# Patient Record
Sex: Female | Born: 1950 | Race: White | Hispanic: No | Smoking: Never smoker
Health system: Southern US, Community
[De-identification: ages and names within clinical notes are randomized; demographics above are authoritative.]

## PROBLEM LIST (undated history)

## (undated) DIAGNOSIS — I10 Essential (primary) hypertension: Secondary | ICD-10-CM

## (undated) DIAGNOSIS — N189 Chronic kidney disease, unspecified: Secondary | ICD-10-CM

## (undated) DIAGNOSIS — C801 Malignant (primary) neoplasm, unspecified: Secondary | ICD-10-CM

## (undated) HISTORY — PX: SKIN CANCER EXCISION: SHX779

## (undated) HISTORY — PX: BUNIONECTOMY: SHX129

## (undated) HISTORY — PX: BACK SURGERY: SHX140

---

## 2004-11-19 ENCOUNTER — Ambulatory Visit: Payer: Self-pay | Admitting: Internal Medicine

## 2005-11-06 ENCOUNTER — Ambulatory Visit: Payer: Self-pay | Admitting: Internal Medicine

## 2005-12-11 ENCOUNTER — Ambulatory Visit: Payer: Self-pay | Admitting: Internal Medicine

## 2006-12-14 ENCOUNTER — Ambulatory Visit: Payer: Self-pay | Admitting: Internal Medicine

## 2007-02-24 ENCOUNTER — Ambulatory Visit: Payer: Self-pay | Admitting: Unknown Physician Specialty

## 2007-05-20 ENCOUNTER — Ambulatory Visit: Payer: Self-pay | Admitting: Internal Medicine

## 2007-06-18 ENCOUNTER — Inpatient Hospital Stay (HOSPITAL_COMMUNITY): Admission: RE | Admit: 2007-06-18 | Discharge: 2007-06-21 | Payer: Self-pay | Admitting: Neurosurgery

## 2007-12-15 ENCOUNTER — Ambulatory Visit: Payer: Self-pay | Admitting: Internal Medicine

## 2008-12-25 ENCOUNTER — Ambulatory Visit: Payer: Self-pay | Admitting: Internal Medicine

## 2010-01-01 ENCOUNTER — Ambulatory Visit: Payer: Self-pay | Admitting: Internal Medicine

## 2010-09-10 NOTE — Op Note (Signed)
NAMESHIMA, COMPERE          ACCOUNT NO.:  [id number]   MEDICAL RECORD NO.:  [id number]          PATIENT TYPE:  INP   LOCATION:  2899                         FACILITY:  [facility]   PHYSICIAN:  Danae Orleans. Venetia Maxon, M.D.  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   PROCEDURE:  Resection of synovial cyst L5-S1 with L4-5 bilateral  decompressive laminectomy, L5-S1 transforaminal lumbar interbody fusion  with a 9 mm PEEK interbody cage and morselized bone autograft and bone  morphogenic protein with FortrOss and post pedicle screw fixation L4  through S1 bilaterally with posterolateral subsegmental instrumentation  L4 through S1 bilaterally with posterolateral arthrodesis of FortrOss  and BMP on the right posterolateral region and morselized bone autograft  on the left posterolateral region.   SURGEON:  Danae Orleans. Venetia Maxon, M.D.   ASSISTANT:  Georgiann Cocker, RN and Clydene Fake, M.D.   ANESTHESIA:  General endotracheal anesthesia.   ESTIMATED BLOOD LOSS:  Six hundred fifty mL with 300 mL Cell Saver blood  returned to the patient.   COMPLICATIONS:  None.   DISPOSITION:  Recovery.   INDICATIONS:  Molly Robbins is a 60 year old woman with spinal  stenosis, spondylolisthesis, synovial cyst who elected to be taken to  surgery for decompression and fusion at these affected levels.   PROCEDURE IN DETAIL:  Molly Robbins was brought to the operating room.  Following a satisfactory and uncomplicated induction of general  endotracheal anesthesia plus intravenous lines and Foley catheter, EMG  electrodes were placed in the L2, L3, L4, L5 and in S1 dermatomes.  The  patient was turned to prone position on the Gabbs table.  Her soft  tissue and bony prominences were padded appropriately.  Her low back was  then prepped and draped in the usual sterile fashion.  The area of  proposed incision was infiltrated with quarter percent Marcaine and  lidocaine with 1:200,000 epinephrine.  The incision was made in the  midline and carried through copious adipose tissue.  The lumbodorsal  fascia was incised which was incised bilaterally.  Subperiosteal  dissection was performed exposing the L4-L5 transverse processes and  sacral ala and self-retaining retractors were placed to facilitate  exposure.  Intraoperative x-rays confirmed correct orientation and  correct levels.  Subsequently a laminotomy of L4 and L5 was performed on  the left and Baxano neural localization probes were introduced and  passed out the neural foramina without difficulty.  Because of  inconsistent electrocautery recordings we elected not to perform  foraminal decompression with the [facility] cutting instrument.  Subsequently total laminectomy of L4 and L5 were performed and the  central spinal canal dura was decompressed both the L4, L5 and sacral  nerve roots were decompressed bilaterally widely.  There was significant  synovial cyst on the left side of the midline which extended out the  neural foramen  which was causing compression and deflection of the L5  nerve root on the left.  Under careful loupe dissection the synovial  cyst was resected and the nerve root appeared to be well decompressed.  Subsequently a diskectomy was performed at the left side of midline at  the L5-S1 level and the endplates were prepared with a variety of  curettes and ring curettes.  After trial sizing a 9 mm large PEEK banana  shaped implant was packed with portion of small BMP kit and also  FortrOss which had been reconstituted and 10 mL of FortrOss which had  been reconstituted with 6 mL of autogenous blood.   The cage was tamped  into position.  Additional BMP had been placed deep to the cage along  with more FortrOss and after the cage was positioned appropriately  additional bone graft and FortrOss material were placed in the  interspace.   Attention was then turned to the L4-5 level where thorough decompression  was performed.  It was felt that a diskectomy would be difficult because  of the relatively small interval between the thecal sac and nerve roots  and it was consequently elected to do a thorough decompression but not  to do an interbody cage at this level.  Subsequently pedicle screws were  placed 6.5 x 40 mm sacral screws, 5.5 x 40 left L5, 5.5 x 45 right L5,  5.5 x 50 L4 screws.  All screws had excellent purchase.  Their position  was confirmed on AP and lateral fluoroscopy.  The posterolateral region  had been decorticated and on the right side of the midline remaining BMP  from the small kit was placed along with FortrOss in the decorticated  posterolateral region and on the left side of midline the remaining bone  autograft was placed.  Sixty mm rods, preloaded rods were locked down in  situ, neural elements were inspected and felt to be well decompressed.  There was excellent hemostasis.  No evidence of any CSF leak.  The self-  retaining retractor was then removed and the lumbodorsal fascia was  closed with 1 Vicryl suture.  Subcutaneous tissues were reapproximated  with 2-0 Vicryl interrupted inverted sutures.  Skin edges were  reapproximated with interrupted 3-0 Vicryl subcuticular stitch.  The  wound was dressed with Benzoin, Steri-Strips, Telfa tape.  The patient  was extubated in the operating room and taken to recovery having  tolerated the surgery well.      Danae Orleans. Venetia Maxon, M.D.  Electronically Signed     JDS/MEDQ  D:  06/18/2007  T:  06/19/2007  Job:  [id number]

## 2010-09-13 NOTE — Discharge Summary (Signed)
[name], [name]          ACCOUNT NO.:  [id number]   MEDICAL RECORD NO.:  [id number]          PATIENT TYPE:  INP   LOCATION:  3011                         FACILITY:  [facility]   PHYSICIAN:  Danae Orleans. Venetia Maxon, M.D.  DATE OF BIRTH:  1951-02-17   DATE OF ADMISSION:  06/18/2007  DATE OF DISCHARGE:  06/21/2007                               DISCHARGE SUMMARY   REASONS FOR ADMISSION:  1. Lumbar spondylolisthesis.  2. Lumbar spondylosis.  3. Lumbar synovial cysts.  4. Lumbar arthropathy.  5. Esophageal reflux.   FINAL DIAGNOSES:  1. Lumbar spondylolisthesis.  2. Lumbar spondylosis  3. Lumbar synovial cyst.  4. Lumbar arthropathy.  5. Esophageal reflux.   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  She does have significant weakness.  She is  admitted to the hospital and underwent lumbar decompression and  resection of synovial cyst, L4 through sacral levels.  Postoperatively,  she was gradually mobilized and was discharged home in stable and  satisfactory condition having tolerated the operation well, with  discharge medications of Percocet and Valium.   DISCHARGE INSTRUCTIONS:  To follow up the office in 3 weeks,  postoperatively, with improved discharged status, and to wear back brace  when up and walking.      Danae Orleans. Venetia Maxon, M.D.  Electronically Signed     JDS/MEDQ  D:  07/22/2007  T:  07/23/2007  Job:  [id number]

## 2010-12-02 ENCOUNTER — Ambulatory Visit: Payer: Self-pay | Admitting: Internal Medicine

## 2011-01-17 LAB — POCT I-STAT 7, (LYTES, BLD GAS, ICA,H+H)
Acid-Base Excess: 3 — ABNORMAL HIGH
Acid-Base Excess: 5 — ABNORMAL HIGH
Bicarbonate: 27.4 — ABNORMAL HIGH
Calcium, Ion: 1.21
HCT: 28 — ABNORMAL LOW
O2 Saturation: 100
Operator id: 151301
Sodium: 140
pO2, Arterial: 330 — ABNORMAL HIGH
pO2, Arterial: 392 — ABNORMAL HIGH

## 2011-01-17 LAB — CBC
Hemoglobin: 13.2
MCV: 93.1
RBC: 4.25
WBC: 6.4

## 2011-01-17 LAB — BASIC METABOLIC PANEL
Chloride: 101
Creatinine, Ser: 1.2
GFR calc Af Amer: 56 — ABNORMAL LOW
Potassium: 3.8
Sodium: 139

## 2011-01-17 LAB — TYPE AND SCREEN: ABO/RH(D): O POS

## 2011-01-17 LAB — ABO/RH: ABO/RH(D): O POS

## 2011-01-17 LAB — POCT I-STAT GLUCOSE: Operator id: 151301

## 2011-01-24 ENCOUNTER — Ambulatory Visit: Payer: Self-pay | Admitting: Internal Medicine

## 2011-04-04 ENCOUNTER — Ambulatory Visit: Payer: Self-pay | Admitting: Unknown Physician Specialty

## 2011-04-07 LAB — PATHOLOGY REPORT

## 2012-01-26 ENCOUNTER — Ambulatory Visit: Payer: Self-pay | Admitting: Internal Medicine

## 2012-01-28 ENCOUNTER — Ambulatory Visit: Payer: Self-pay | Admitting: Internal Medicine

## 2012-07-29 ENCOUNTER — Ambulatory Visit: Payer: Self-pay | Admitting: Internal Medicine

## 2012-12-17 ENCOUNTER — Ambulatory Visit: Payer: Self-pay | Admitting: Internal Medicine

## 2013-02-01 ENCOUNTER — Ambulatory Visit: Payer: Self-pay | Admitting: Internal Medicine

## 2013-08-25 ENCOUNTER — Ambulatory Visit: Payer: Self-pay | Admitting: Internal Medicine

## 2014-02-10 ENCOUNTER — Ambulatory Visit: Payer: Self-pay | Admitting: Internal Medicine

## 2015-01-25 ENCOUNTER — Other Ambulatory Visit: Payer: Self-pay | Admitting: Internal Medicine

## 2015-01-25 DIAGNOSIS — Z1231 Encounter for screening mammogram for malignant neoplasm of breast: Secondary | ICD-10-CM

## 2015-02-23 ENCOUNTER — Ambulatory Visit
Admission: RE | Admit: 2015-02-23 | Discharge: 2015-02-23 | Disposition: A | Discharge status: Home / Self Care | Payer: BLUE CROSS/BLUE SHIELD | Source: Ambulatory Visit | Attending: Internal Medicine | Admitting: Internal Medicine

## 2015-02-23 DIAGNOSIS — Z1231 Encounter for screening mammogram for malignant neoplasm of breast: Secondary | ICD-10-CM | POA: Diagnosis not present

## 2016-01-17 ENCOUNTER — Other Ambulatory Visit: Payer: Self-pay | Admitting: Internal Medicine

## 2016-01-17 DIAGNOSIS — Z1231 Encounter for screening mammogram for malignant neoplasm of breast: Secondary | ICD-10-CM

## 2016-02-28 ENCOUNTER — Ambulatory Visit
Admission: RE | Admit: 2016-02-28 | Discharge: 2016-02-28 | Disposition: A | Discharge status: Home / Self Care | Payer: Medicare Other | Source: Ambulatory Visit | Attending: Internal Medicine | Admitting: Internal Medicine

## 2016-02-28 DIAGNOSIS — Z1231 Encounter for screening mammogram for malignant neoplasm of breast: Secondary | ICD-10-CM | POA: Insufficient documentation

## 2016-10-17 ENCOUNTER — Other Ambulatory Visit: Payer: Self-pay | Admitting: Internal Medicine

## 2016-10-17 DIAGNOSIS — M79604 Pain in right leg: Secondary | ICD-10-CM

## 2016-11-06 ENCOUNTER — Ambulatory Visit
Admission: RE | Admit: 2016-11-06 | Discharge: 2016-11-06 | Disposition: A | Discharge status: Home / Self Care | Payer: Medicare Other | Source: Ambulatory Visit | Attending: Internal Medicine | Admitting: Internal Medicine

## 2016-11-06 DIAGNOSIS — M7121 Synovial cyst of popliteal space [Baker], right knee: Secondary | ICD-10-CM | POA: Diagnosis not present

## 2016-11-06 DIAGNOSIS — M79604 Pain in right leg: Secondary | ICD-10-CM | POA: Insufficient documentation

## 2016-12-23 ENCOUNTER — Other Ambulatory Visit: Payer: Self-pay | Admitting: Internal Medicine

## 2016-12-23 DIAGNOSIS — Z1231 Encounter for screening mammogram for malignant neoplasm of breast: Secondary | ICD-10-CM

## 2017-03-12 ENCOUNTER — Ambulatory Visit
Admission: RE | Admit: 2017-03-12 | Discharge: 2017-03-12 | Disposition: A | Discharge status: Home / Self Care | Payer: Medicare Other | Source: Ambulatory Visit | Attending: Internal Medicine | Admitting: Internal Medicine

## 2017-03-12 DIAGNOSIS — Z1231 Encounter for screening mammogram for malignant neoplasm of breast: Secondary | ICD-10-CM | POA: Insufficient documentation

## 2018-02-19 ENCOUNTER — Other Ambulatory Visit: Payer: Self-pay | Admitting: Internal Medicine

## 2018-02-19 DIAGNOSIS — Z1231 Encounter for screening mammogram for malignant neoplasm of breast: Secondary | ICD-10-CM

## 2018-03-16 ENCOUNTER — Ambulatory Visit
Admission: RE | Admit: 2018-03-16 | Discharge: 2018-03-16 | Disposition: A | Discharge status: Home / Self Care | Payer: Medicare Other | Source: Ambulatory Visit | Attending: Internal Medicine | Admitting: Internal Medicine

## 2018-03-16 DIAGNOSIS — Z1231 Encounter for screening mammogram for malignant neoplasm of breast: Secondary | ICD-10-CM | POA: Insufficient documentation

## 2019-02-11 ENCOUNTER — Other Ambulatory Visit: Payer: Self-pay | Admitting: Internal Medicine

## 2019-02-11 DIAGNOSIS — Z1231 Encounter for screening mammogram for malignant neoplasm of breast: Secondary | ICD-10-CM

## 2019-03-18 ENCOUNTER — Ambulatory Visit
Admission: RE | Admit: 2019-03-18 | Discharge: 2019-03-18 | Disposition: A | Discharge status: Home / Self Care | Payer: Medicare Other | Source: Ambulatory Visit | Attending: Internal Medicine | Admitting: Internal Medicine

## 2019-03-18 DIAGNOSIS — Z1231 Encounter for screening mammogram for malignant neoplasm of breast: Secondary | ICD-10-CM | POA: Diagnosis not present

## 2019-11-09 ENCOUNTER — Other Ambulatory Visit: Payer: Self-pay | Admitting: Internal Medicine

## 2019-11-09 DIAGNOSIS — Z1231 Encounter for screening mammogram for malignant neoplasm of breast: Secondary | ICD-10-CM

## 2020-03-20 ENCOUNTER — Ambulatory Visit
Admission: RE | Admit: 2020-03-20 | Discharge: 2020-03-20 | Disposition: A | Discharge status: Home / Self Care | Payer: Medicare Other | Source: Ambulatory Visit | Attending: Internal Medicine | Admitting: Internal Medicine

## 2020-03-20 ENCOUNTER — Other Ambulatory Visit: Payer: Self-pay

## 2020-03-20 DIAGNOSIS — Z1231 Encounter for screening mammogram for malignant neoplasm of breast: Secondary | ICD-10-CM | POA: Diagnosis not present

## 2021-02-14 ENCOUNTER — Other Ambulatory Visit: Payer: Self-pay | Admitting: Internal Medicine

## 2021-02-14 DIAGNOSIS — Z1231 Encounter for screening mammogram for malignant neoplasm of breast: Secondary | ICD-10-CM

## 2021-03-25 ENCOUNTER — Other Ambulatory Visit: Payer: Self-pay

## 2021-03-25 ENCOUNTER — Ambulatory Visit
Admission: RE | Admit: 2021-03-25 | Discharge: 2021-03-25 | Disposition: A | Discharge status: Home / Self Care | Payer: Medicare Other | Source: Ambulatory Visit | Attending: Internal Medicine | Admitting: Internal Medicine

## 2021-03-25 DIAGNOSIS — Z1231 Encounter for screening mammogram for malignant neoplasm of breast: Secondary | ICD-10-CM | POA: Diagnosis present

## 2021-08-12 ENCOUNTER — Other Ambulatory Visit: Payer: Self-pay | Admitting: Internal Medicine

## 2021-08-12 DIAGNOSIS — N6312 Unspecified lump in the right breast, upper inner quadrant: Secondary | ICD-10-CM

## 2021-09-05 ENCOUNTER — Ambulatory Visit
Admission: RE | Admit: 2021-09-05 | Discharge: 2021-09-05 | Disposition: A | Discharge status: Home / Self Care | Payer: Medicare Other | Source: Ambulatory Visit | Attending: Internal Medicine | Admitting: Internal Medicine

## 2021-09-05 DIAGNOSIS — N6312 Unspecified lump in the right breast, upper inner quadrant: Secondary | ICD-10-CM | POA: Diagnosis present

## 2021-10-11 ENCOUNTER — Encounter: Payer: Self-pay | Admitting: Gastroenterology

## 2021-10-14 ENCOUNTER — Encounter
Admission: RE | Disposition: A | Discharge status: Home / Self Care | Payer: Self-pay | Source: Ambulatory Visit | Attending: Gastroenterology

## 2021-10-14 ENCOUNTER — Other Ambulatory Visit: Payer: Self-pay

## 2021-10-14 ENCOUNTER — Ambulatory Visit: Payer: Medicare Other | Admitting: Certified Registered Nurse Anesthetist

## 2021-10-14 ENCOUNTER — Encounter: Payer: Self-pay | Admitting: Gastroenterology

## 2021-10-14 ENCOUNTER — Ambulatory Visit
Admission: RE | Admit: 2021-10-14 | Discharge: 2021-10-14 | Disposition: A | Discharge status: Home / Self Care | Payer: Medicare Other | Source: Ambulatory Visit | Attending: Gastroenterology | Admitting: Gastroenterology

## 2021-10-14 DIAGNOSIS — Z1211 Encounter for screening for malignant neoplasm of colon: Secondary | ICD-10-CM | POA: Insufficient documentation

## 2021-10-14 DIAGNOSIS — Z800 Family history of malignant neoplasm of digestive organs: Secondary | ICD-10-CM | POA: Insufficient documentation

## 2021-10-14 DIAGNOSIS — K640 First degree hemorrhoids: Secondary | ICD-10-CM | POA: Diagnosis not present

## 2021-10-14 DIAGNOSIS — Z86010 Personal history of colonic polyps: Secondary | ICD-10-CM | POA: Diagnosis not present

## 2021-10-14 DIAGNOSIS — D120 Benign neoplasm of cecum: Secondary | ICD-10-CM | POA: Insufficient documentation

## 2021-10-14 DIAGNOSIS — K6289 Other specified diseases of anus and rectum: Secondary | ICD-10-CM | POA: Diagnosis not present

## 2021-10-14 DIAGNOSIS — K5730 Diverticulosis of large intestine without perforation or abscess without bleeding: Secondary | ICD-10-CM | POA: Diagnosis not present

## 2021-10-14 DIAGNOSIS — N189 Chronic kidney disease, unspecified: Secondary | ICD-10-CM | POA: Insufficient documentation

## 2021-10-14 DIAGNOSIS — I129 Hypertensive chronic kidney disease with stage 1 through stage 4 chronic kidney disease, or unspecified chronic kidney disease: Secondary | ICD-10-CM | POA: Diagnosis not present

## 2021-10-14 DIAGNOSIS — D123 Benign neoplasm of transverse colon: Secondary | ICD-10-CM | POA: Insufficient documentation

## 2021-10-14 HISTORY — DX: Essential (primary) hypertension: I10

## 2021-10-14 HISTORY — DX: Chronic kidney disease, unspecified: N18.9

## 2021-10-14 HISTORY — DX: Malignant (primary) neoplasm, unspecified: C80.1

## 2021-10-14 HISTORY — PX: COLONOSCOPY: SHX5424

## 2021-10-14 SURGERY — COLONOSCOPY
Anesthesia: General

## 2021-10-14 MED ORDER — PROPOFOL 500 MG/50ML IV EMUL
INTRAVENOUS | Status: DC | PRN
  Administered 2021-10-14: 200 ug/kg/min via INTRAVENOUS

## 2021-10-14 MED ORDER — LIDOCAINE HCL URETHRAL/MUCOSAL 2 % EX GEL
CUTANEOUS | Status: AC
  Filled 2021-10-14: qty 5

## 2021-10-14 MED ORDER — ONDANSETRON HCL 4 MG/2ML IJ SOLN
INTRAMUSCULAR | Status: DC | PRN
  Administered 2021-10-14: 4 mg via INTRAVENOUS

## 2021-10-14 MED ORDER — SODIUM CHLORIDE 0.9 % IV SOLN
INTRAVENOUS | Status: DC
Start: 2021-10-14 — End: 2021-10-14

## 2021-10-14 MED ORDER — PROPOFOL 10 MG/ML IV BOLUS
INTRAVENOUS | Status: DC | PRN
  Administered 2021-10-14: 60 mg via INTRAVENOUS

## 2021-10-14 MED ORDER — PROPOFOL 1000 MG/100ML IV EMUL
INTRAVENOUS | Status: AC
  Filled 2021-10-14: qty 100

## 2021-10-14 MED ORDER — LIDOCAINE HCL (CARDIAC) PF 100 MG/5ML IV SOSY
PREFILLED_SYRINGE | INTRAVENOUS | Status: DC | PRN
  Administered 2021-10-14: 50 mg via INTRAVENOUS

## 2021-10-14 MED ORDER — ONDANSETRON HCL 4 MG/2ML IJ SOLN
INTRAMUSCULAR | Status: AC
  Filled 2021-10-14: qty 2

## 2021-10-14 NOTE — H&P (Signed)
Pre-Procedure H&P   Patient ID: Molly Robbins is a 71 y.o. female.  Gastroenterology Provider: Annamaria Helling, DO  Referring Provider: Dawson Bills, NP PCP: Idelle Crouch, MD  Date: 10/14/2021  HPI Ms. Molly Robbins is a 71 y.o. female who presents today for Colonoscopy for surveillance- phx colon polyps.  Patient with a personal history of colon polyps and family history of colon cancer (brother).  Currently notes abdominal bloating.  Bowel movements have otherwise been regular no melena constipation diarrhea or hematochezia.  No appetite or weight changes.  Last underwent colonoscopy in 2018 with 2 tubular adenomas and internal hemorrhoids.  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  HENT:  Negative for trouble swallowing and voice change.   Respiratory:  Negative for shortness of breath and wheezing.   Cardiovascular:  Negative for chest pain, palpitations and leg swelling.  Gastrointestinal:  Negative for abdominal pain, anal bleeding, blood in stool, constipation, diarrhea, nausea, rectal pain and vomiting.       +bloating  Musculoskeletal:  Negative for arthralgias and myalgias.  Skin:  Negative for color change and pallor.  Neurological:  Negative for dizziness, syncope and weakness.  Psychiatric/Behavioral:  Negative for confusion.   All other systems reviewed and are negative.    Medications No current facility-administered medications on file prior to encounter.    Current Outpatient Medications on File Prior to Encounter  Medication Sig Dispense Refill   aspirin EC 81 MG tablet Take 81 mg by mouth daily. Swallow whole.     Cholecalciferol (VITAMIN D3) 25 MCG (1000 UT) CAPS Take 1,000 Units by mouth daily.     Multiple Vitamins-Minerals (MULTIVITAMIN WITH MINERALS) tablet Take 1 tablet by mouth daily.     omega-3 acid ethyl esters (LOVAZA) 1 g capsule Take 1 g by mouth 2 (two) times daily.     QC TUMERIC COMPLEX 500 MG CAPS Take 1 capsule by mouth daily.     torsemide (DEMADEX) 20 MG tablet Take 20 mg by mouth daily.     Ferrous Fumarate (HEMOCYTE - 106 MG FE) 324 (106 Fe) MG TABS tablet Take 1 tablet by mouth daily. (Patient not taking: Reported on 10/14/2021)      Pertinent medications related to GI and procedure were reviewed by me with the patient prior to the procedure  No current facility-administered medications for this encounter.      No Known Allergies Allergies were reviewed by me prior to the procedure  Objective   Body mass index is 30.95 kg/m. Vitals:   10/14/21 0723  BP: 137/80  Pulse: 81  Resp: 16  Temp: 97.6 F (36.4 C)  TempSrc: Temporal  SpO2: 100%  Weight: 84.4 kg  Height: '5\' 5"'$  (1.651 m)     Physical Exam Vitals and nursing note reviewed.  Constitutional:      General: She is not in acute distress.    Appearance: Normal appearance. She  is not ill-appearing, toxic-appearing or diaphoretic.  HENT:     Head: Normocephalic and atraumatic.     Nose: Nose normal.     Mouth/Throat:     Mouth: Mucous membranes are moist.     Pharynx: Oropharynx is clear.  Eyes:     General: No scleral icterus.    Extraocular Movements: Extraocular movements intact.  Cardiovascular:     Rate and Rhythm: Normal rate and regular rhythm.     Heart sounds: Normal heart sounds. No murmur heard.    No friction rub. No gallop.  Pulmonary:     Effort: Pulmonary effort is normal. No respiratory distress.     Breath sounds: Normal  breath sounds. No wheezing, rhonchi or rales.  Abdominal:     General: Bowel sounds are normal. There is no distension.     Palpations: Abdomen is soft.     Tenderness: There is no abdominal tenderness. There is no guarding or rebound.  Musculoskeletal:     Cervical back: Neck supple.     Right lower leg: No edema.     Left lower leg: No edema.  Skin:    General: Skin is warm and dry.     Coloration: Skin is not jaundiced or pale.  Neurological:     General: No focal deficit present.     Mental Status: She is alert and oriented to person, place, and time. Mental status is at baseline.  Psychiatric:        Mood and Affect: Mood normal.        Behavior: Behavior normal.        Thought Content: Thought content normal.        Judgment: Judgment normal.      Assessment:  Molly Robbins is a 71 y.o. female  who presents today for Colonoscopy for surveillance- phx colon polyps.  Plan:  Colonoscopy with possible intervention today  Colonoscopy with possible biopsy, control of bleeding, polypectomy, and interventions as necessary has been discussed with the patient/patient representative. Informed consent was obtained from the patient/patient representative after explaining the indication, nature, and risks of the procedure including but not limited to death, bleeding, perforation, missed neoplasm/lesions, cardiorespiratory compromise, and reaction to medications. Opportunity for questions was given and appropriate answers were provided. Patient/patient representative has verbalized understanding is amenable to undergoing the procedure.   Annamaria Helling, DO  [facility]  Portions of the record may have been created with voice recognition software. Occasional wrong-word or 'sound-a-like' substitutions may have occurred due to the inherent limitations of voice recognition software.  Read the chart carefully and recognize, using context, where  substitutions may have occurred.

## 2021-10-14 NOTE — Op Note (Signed)
[facility] Gastroenterology Patient Name: Molly Robbins Procedure Date: 10/14/2021 7:26 AM MRN: [id number] Account #: [id number] Date of Birth: 1950-06-29 Admit Type: Outpatient Age: 71 Room: [facility] ENDO ROOM 2 Gender: Female Note Status: Finalized Instrument Name: Park Meo [id number] Procedure:             Colonoscopy Indications:           Screening in patient at increased risk: Family history                         of 1st-degree relative with colorectal cancer, High                         risk colon cancer surveillance: Personal history of                         colonic polyps Providers:             Annamaria Helling DO, DO Medicines:             Monitored Anesthesia Care Complications:         No immediate complications. Estimated blood loss:                         Minimal. Procedure:             Pre-Anesthesia Assessment:                        - Prior to the procedure, a History and Physical was                         performed, and patient medications and allergies were                         reviewed. The patient is competent. The risks and                         benefits of the procedure and the sedation options and                         risks were discussed with the patient. All questions                         were answered and informed consent was obtained.                         Patient identification and proposed procedure were                         verified by the physician, the nurse, the anesthetist                         and the technician in the endoscopy suite. Mental                         Status Examination: alert and oriented. Airway                         Examination: normal oropharyngeal airway and neck  mobility. Respiratory Examination: clear to                         auscultation. CV Examination: RRR, no murmurs, no S3                         or S4. Prophylactic Antibiotics: The patient  does not                         require prophylactic antibiotics. Prior                         Anticoagulants: The patient has taken no previous                         anticoagulant or antiplatelet agents. ASA Grade                         Assessment: II - A patient with mild systemic disease.                         After reviewing the risks and benefits, the patient                         was deemed in satisfactory condition to undergo the                         procedure. The anesthesia plan was to use monitored                         anesthesia care (MAC). Immediately prior to                         administration of medications, the patient was                         re-assessed for adequacy to receive sedatives. The                         heart rate, respiratory rate, oxygen saturations,                         blood pressure, adequacy of pulmonary ventilation, and                         response to care were monitored throughout the                         procedure. The physical status of the patient was                         re-assessed after the procedure.                        After obtaining informed consent, the colonoscope was                         passed under direct vision. Throughout the procedure,  the patient's blood pressure, pulse, and oxygen                         saturations were monitored continuously. The                         Colonoscope was introduced through the anus and                         advanced to the the cecum, identified by appendiceal                         orifice and ileocecal valve. The colonoscopy was                         performed without difficulty. The patient tolerated                         the procedure well. The quality of the bowel                         preparation was evaluated using the BBPS [facility] Bowel                         Preparation Scale) with scores of: Right Colon = 3,                          Transverse Colon = 3 and Left Colon = 3 (entire mucosa                         seen well with no residual staining, small fragments                         of stool or opaque liquid). The total BBPS score                         equals 9. The ileocecal valve, appendiceal orifice,                         and rectum were photographed. Findings:      The perianal and digital rectal examinations were normal. Pertinent       negatives include normal sphincter tone.      A few small-mouthed diverticula were found in the recto-sigmoid colon       and sigmoid colon. Estimated blood loss: none.      Non-bleeding internal hemorrhoids were found during retroflexion. The       hemorrhoids were Grade I (internal hemorrhoids that do not prolapse).       Estimated blood loss: none.      Anal papilla(e) were hypertrophied. Estimated blood loss: none.      Two sessile polyps were found in the transverse colon and cecum. The       polyps were 1 to 2 mm in size. These polyps were removed with a jumbo       cold forceps. Resection and retrieval were complete. Estimated blood       loss was minimal.      The exam was otherwise without abnormality on direct and retroflexion  views. Impression:            - Diverticulosis in the recto-sigmoid colon and in the                         sigmoid colon.                        - Non-bleeding internal hemorrhoids.                        - Anal papilla(e) were hypertrophied.                        - Two 1 to 2 mm polyps in the transverse colon and in                         the cecum, removed with a jumbo cold forceps. Resected                         and retrieved.                        - The examination was otherwise normal on direct and                         retroflexion views. Recommendation:        - Discharge patient to home.                        - Resume previous diet.                        - Continue present medications.                         - Await pathology results.                        - Repeat colonoscopy for surveillance based on                         pathology results.                        - Return to referring physician as previously                         scheduled.                        - The findings and recommendations were discussed with                         the patient. Procedure Code(s):     --- Professional ---                        [phone], Colonoscopy, flexible; with biopsy, single or                         multiple Diagnosis Code(s):     --- Professional ---  Z80.0, Family history of malignant neoplasm of                         digestive organs                        Z86.010, Personal history of colonic polyps                        K64.0, First degree hemorrhoids                        K62.89, Other specified diseases of anus and rectum                        K63.5, Polyp of colon                        K57.30, Diverticulosis of large intestine without                         perforation or abscess without bleeding CPT copyright 2019 American Medical Association. All rights reserved. The codes documented in this report are preliminary and upon coder review may  be revised to meet current compliance requirements. Attending Participation:      I personally performed the entire procedure. Volney American, DO Annamaria Helling DO, DO 10/14/2021 8:30:11 AM This report has been signed electronically. Number of Addenda: 0 Note Initiated On: 10/14/2021 7:26 AM Scope Withdrawal Time: 0 hours 17 minutes 4 seconds  Total Procedure Duration: 0 hours 36 minutes 5 seconds  Estimated Blood Loss:  Estimated blood loss was minimal.      [facility]

## 2021-10-14 NOTE — Anesthesia Postprocedure Evaluation (Signed)
Anesthesia Post Note  Patient: Molly Robbins  Procedure(s) Performed: COLONOSCOPY  Patient location during evaluation: PACU Anesthesia Type: General Level of consciousness: awake and alert Pain management: pain level controlled Vital Signs Assessment: post-procedure vital signs reviewed and stable Respiratory status: spontaneous breathing, nonlabored ventilation, respiratory function stable and patient connected to nasal cannula oxygen Cardiovascular status: blood pressure returned to baseline and stable Postop Assessment: no apparent nausea or vomiting Anesthetic complications: no   No notable events documented.   Last Vitals:  Vitals:   10/14/21 0842 10/14/21 0852  BP: 120/75 133/90  Pulse: (!) 51 (!) 46  Resp: 16 (!) 21  Temp:    SpO2: 100% 100%    Last Pain:  Vitals:   10/14/21 0852  TempSrc:   PainSc: 0-No pain                 [name]

## 2021-10-14 NOTE — Interval H&P Note (Signed)
History and Physical Interval Note: Preprocedure H&P from 10/14/21  was reviewed and there was no interval change after seeing and examining the patient.  Written consent was obtained from the patient after discussion of risks, benefits, and alternatives. Patient has consented to proceed with Colonoscopy with possible intervention   10/14/2021 8:40 AM  Molly Robbins  has presented today for surgery, with the diagnosis of Hx of adenomatous colonic polyps (Z86.010) Excessive gas (R14.3).  The various methods of treatment have been discussed with the patient and family. After consideration of risks, benefits and other options for treatment, the patient has consented to  Procedure(s): COLONOSCOPY (N/A) as a surgical intervention.  The patient's history has been reviewed, patient examined, no change in status, stable for surgery.  I have reviewed the patient's chart and labs.  Questions were answered to the patient's satisfaction.     Annamaria Helling

## 2021-10-14 NOTE — Anesthesia Preprocedure Evaluation (Signed)
Anesthesia Evaluation  Patient identified by MRN, date of birth, ID band Patient awake    Reviewed: Allergy & Precautions, H&P , NPO status , Patient's Chart, lab work & pertinent test results, reviewed documented beta blocker date and time   Airway Mallampati: II   Neck ROM: full    Dental  (+) Poor Dentition   Pulmonary neg pulmonary ROS,    Pulmonary exam normal        Cardiovascular negative cardio ROS Normal cardiovascular exam Rhythm:regular Rate:Normal     Neuro/Psych negative neurological ROS  negative psych ROS   GI/Hepatic negative GI ROS, Neg liver ROS,   Endo/Other  negative endocrine ROS  Renal/GU Renal disease  negative genitourinary   Musculoskeletal   Abdominal   Peds  Hematology negative hematology ROS (+)   Anesthesia Other Findings Past Medical History: No date: Chronic kidney disease History reviewed. No pertinent surgical history.   Reproductive/Obstetrics negative OB ROS                             Anesthesia Physical Anesthesia Plan  ASA: 2  Anesthesia Plan: General   Post-op Pain Management:    Induction:   PONV Risk Score and Plan:   Airway Management Planned:   Additional Equipment:   Intra-op Plan:   Post-operative Plan:   Informed Consent: I have reviewed the patients History and Physical, chart, labs and discussed the procedure including the risks, benefits and alternatives for the proposed anesthesia with the patient or authorized representative who has indicated his/her understanding and acceptance.     Dental Advisory Given  Plan Discussed with: CRNA  Anesthesia Plan Comments:         Anesthesia Quick Evaluation

## 2021-10-14 NOTE — Transfer of Care (Signed)
Immediate Anesthesia Transfer of Care Note  Patient: Molly Robbins  Procedure(s) Performed: COLONOSCOPY  Patient Location: PACU and Endoscopy Unit  Anesthesia Type:MAC  Level of Consciousness: awake, alert  and oriented  Airway & Oxygen Therapy: Patient Spontanous Breathing  Post-op Assessment: Report given to RN and Post -op Vital signs reviewed and stable  Post vital signs: Reviewed and stable  Last Vitals:  Vitals Value Taken Time  BP 103/63 10/14/21 0832  Temp 36.1 C 10/14/21 0832  Pulse 52 10/14/21 0836  Resp 11 10/14/21 0836  SpO2 99 % 10/14/21 0836  Vitals shown include unvalidated device data.  Last Pain:  Vitals:   10/14/21 0832  TempSrc: Tympanic  PainSc: Asleep         Complications: No notable events documented.

## 2021-10-15 ENCOUNTER — Encounter: Payer: Self-pay | Admitting: Gastroenterology

## 2021-10-15 LAB — SURGICAL PATHOLOGY

## 2022-02-24 ENCOUNTER — Other Ambulatory Visit: Payer: Self-pay | Admitting: Internal Medicine

## 2022-02-24 DIAGNOSIS — Z1231 Encounter for screening mammogram for malignant neoplasm of breast: Secondary | ICD-10-CM

## 2022-04-07 ENCOUNTER — Ambulatory Visit
Admission: RE | Admit: 2022-04-07 | Discharge: 2022-04-07 | Disposition: A | Discharge status: Home / Self Care | Payer: Medicare Other | Source: Ambulatory Visit | Attending: Internal Medicine | Admitting: Internal Medicine

## 2022-04-07 DIAGNOSIS — Z1231 Encounter for screening mammogram for malignant neoplasm of breast: Secondary | ICD-10-CM | POA: Diagnosis not present

## 2022-04-11 ENCOUNTER — Other Ambulatory Visit: Payer: Self-pay | Admitting: Internal Medicine

## 2022-04-11 DIAGNOSIS — R928 Other abnormal and inconclusive findings on diagnostic imaging of breast: Secondary | ICD-10-CM

## 2022-04-11 DIAGNOSIS — N63 Unspecified lump in unspecified breast: Secondary | ICD-10-CM

## 2022-04-23 ENCOUNTER — Ambulatory Visit
Admission: RE | Admit: 2022-04-23 | Discharge: 2022-04-23 | Disposition: A | Discharge status: Home / Self Care | Payer: Medicare Other | Source: Ambulatory Visit | Attending: Internal Medicine | Admitting: Internal Medicine

## 2022-04-23 DIAGNOSIS — N63 Unspecified lump in unspecified breast: Secondary | ICD-10-CM

## 2022-04-23 DIAGNOSIS — R928 Other abnormal and inconclusive findings on diagnostic imaging of breast: Secondary | ICD-10-CM | POA: Diagnosis not present

## 2022-07-14 ENCOUNTER — Other Ambulatory Visit: Payer: Self-pay | Admitting: Neurology

## 2022-07-14 DIAGNOSIS — R2689 Other abnormalities of gait and mobility: Secondary | ICD-10-CM

## 2022-07-14 DIAGNOSIS — R251 Tremor, unspecified: Secondary | ICD-10-CM

## 2022-07-19 ENCOUNTER — Ambulatory Visit
Admission: RE | Admit: 2022-07-19 | Discharge: 2022-07-19 | Disposition: A | Discharge status: Home / Self Care | Payer: Medicare Other | Source: Ambulatory Visit | Attending: Neurology | Admitting: Neurology

## 2022-07-19 DIAGNOSIS — R2689 Other abnormalities of gait and mobility: Secondary | ICD-10-CM | POA: Diagnosis present

## 2022-07-19 DIAGNOSIS — R251 Tremor, unspecified: Secondary | ICD-10-CM | POA: Insufficient documentation

## 2022-12-12 IMAGING — MG MM DIGITAL DIAGNOSTIC UNILAT*R* W/ TOMO W/ CAD
6 series · 6 of 18 positions shown · non-contrast
Comparison: Previous exam(s).

CLINICAL DATA: Palpable area in the RIGHT inner breast. History of
trauma to the breast with significant bruising.

EXAM:
DIGITAL DIAGNOSTIC UNILATERAL RIGHT MAMMOGRAM WITH TOMOSYNTHESIS AND
CAD; ULTRASOUND RIGHT BREAST LIMITED
TECHNIQUE: Right digital diagnostic mammography and breast tomosynthesis was
performed. The images were evaluated with computer-aided detection.;
Targeted ultrasound examination of the right breast was performed

[R MLO synth-2D]
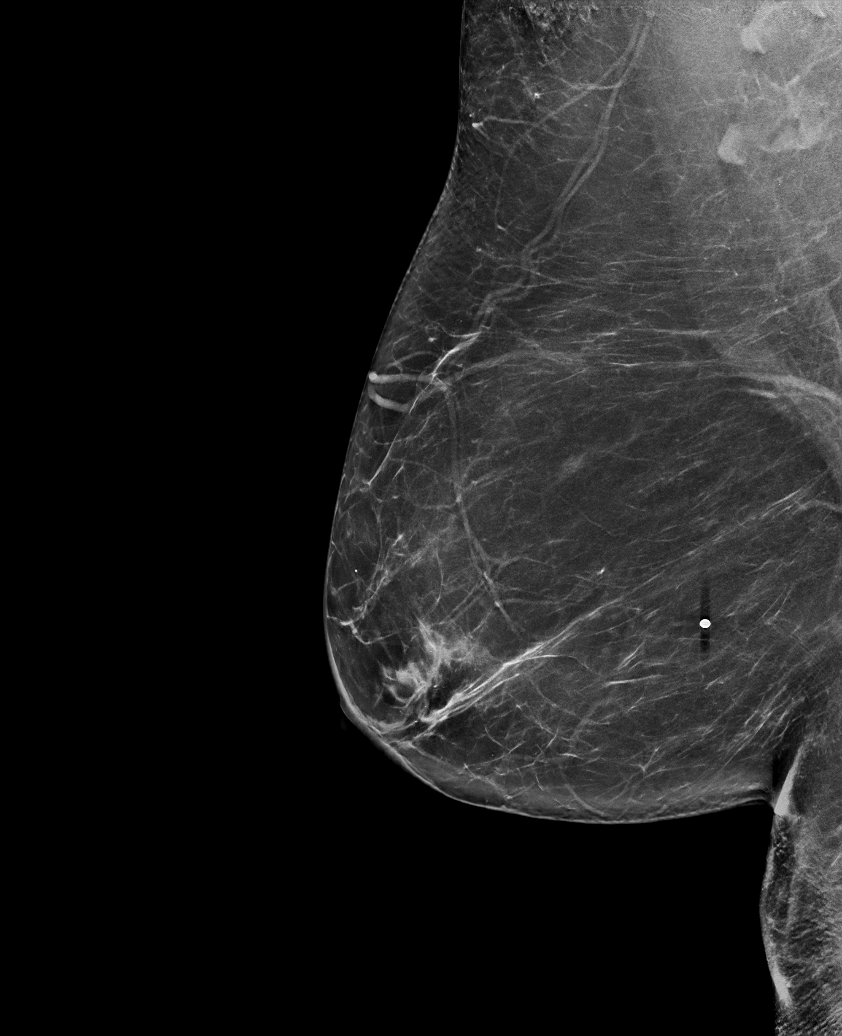

[R CC synth-2D]
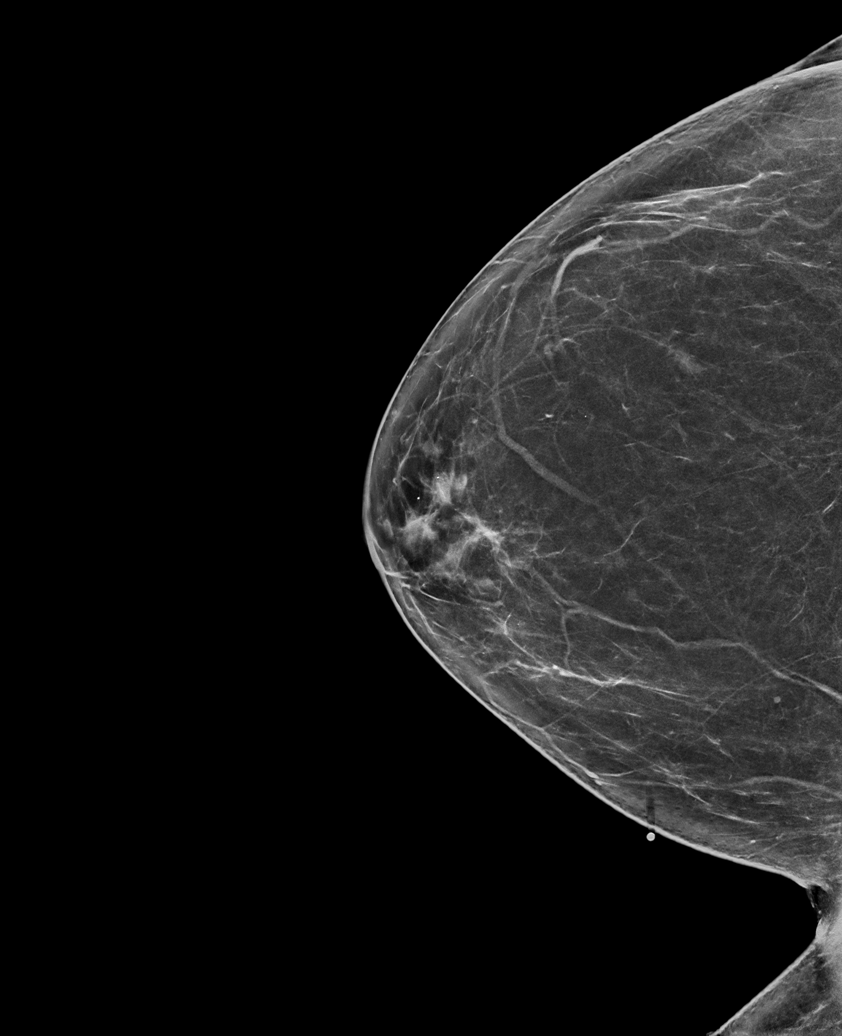

[R TAN synth-2D]
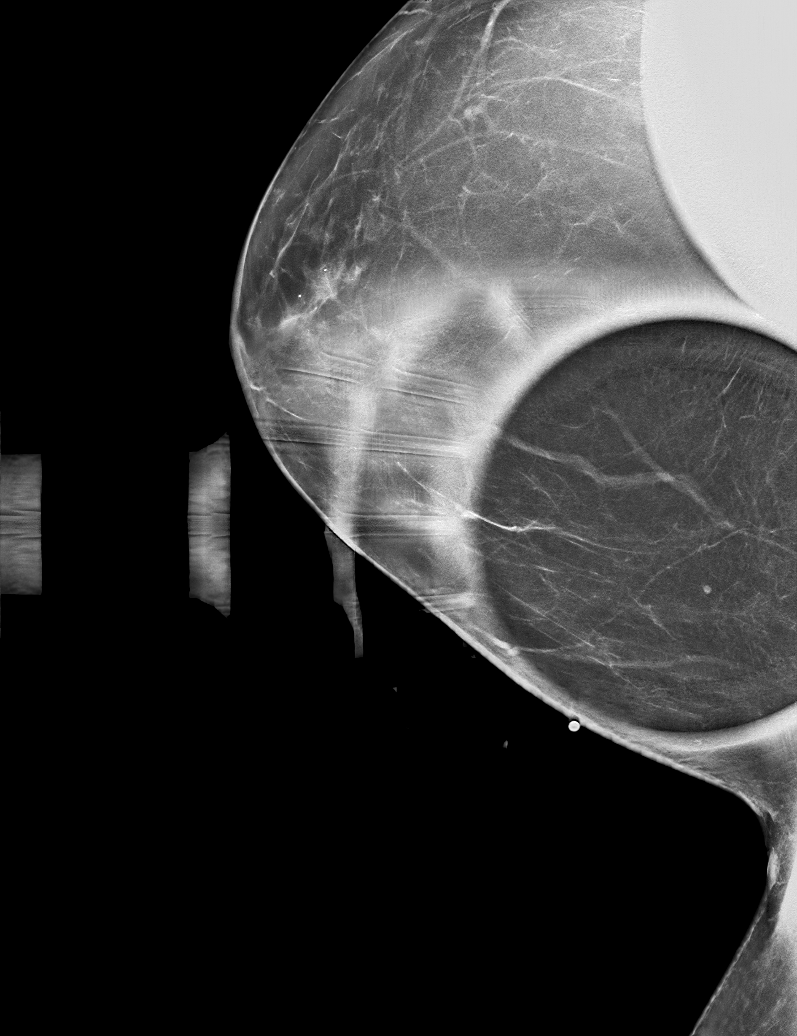

[R MLO tomo · tomo slice 45/88.0]
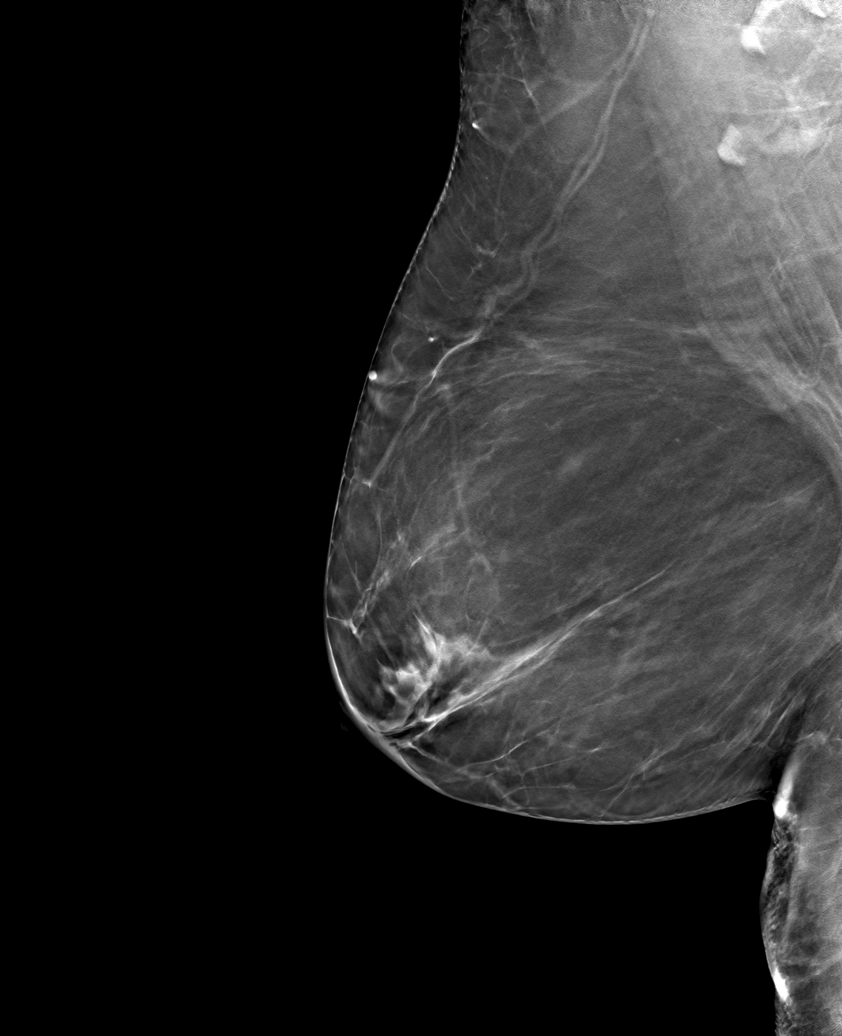

[R CC tomo · tomo slice 39/78.0]
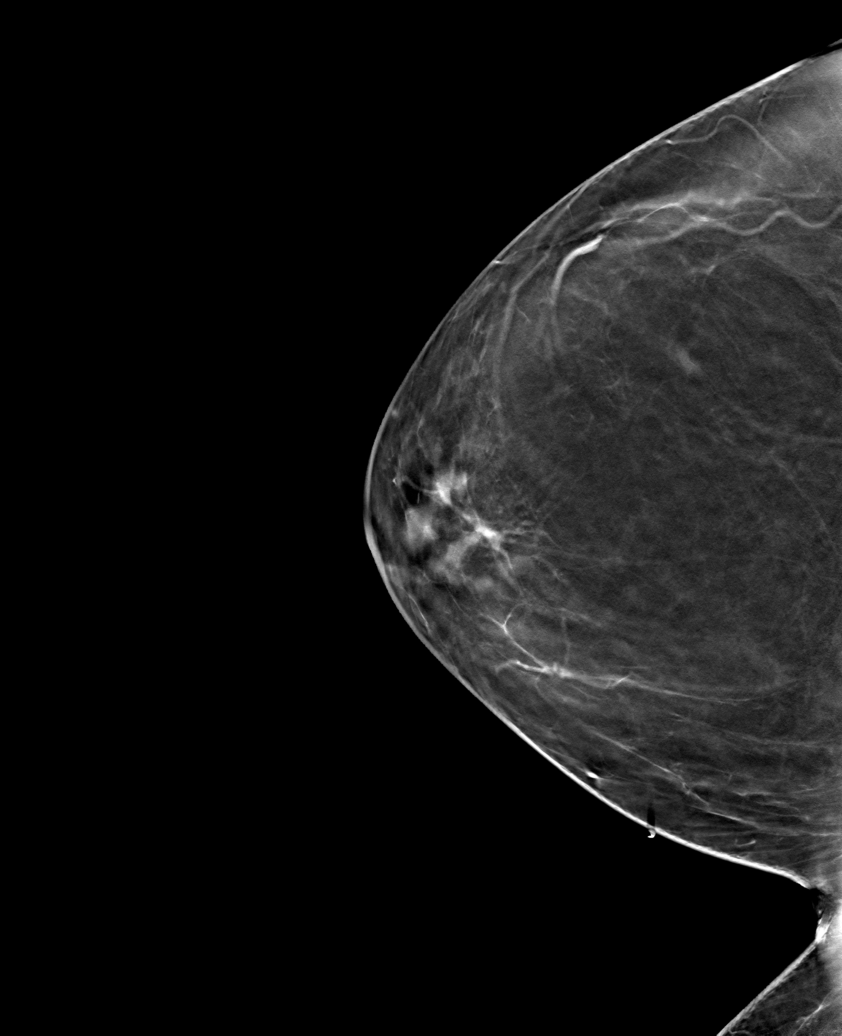

[R TAN tomo · tomo slice 35/70.0]
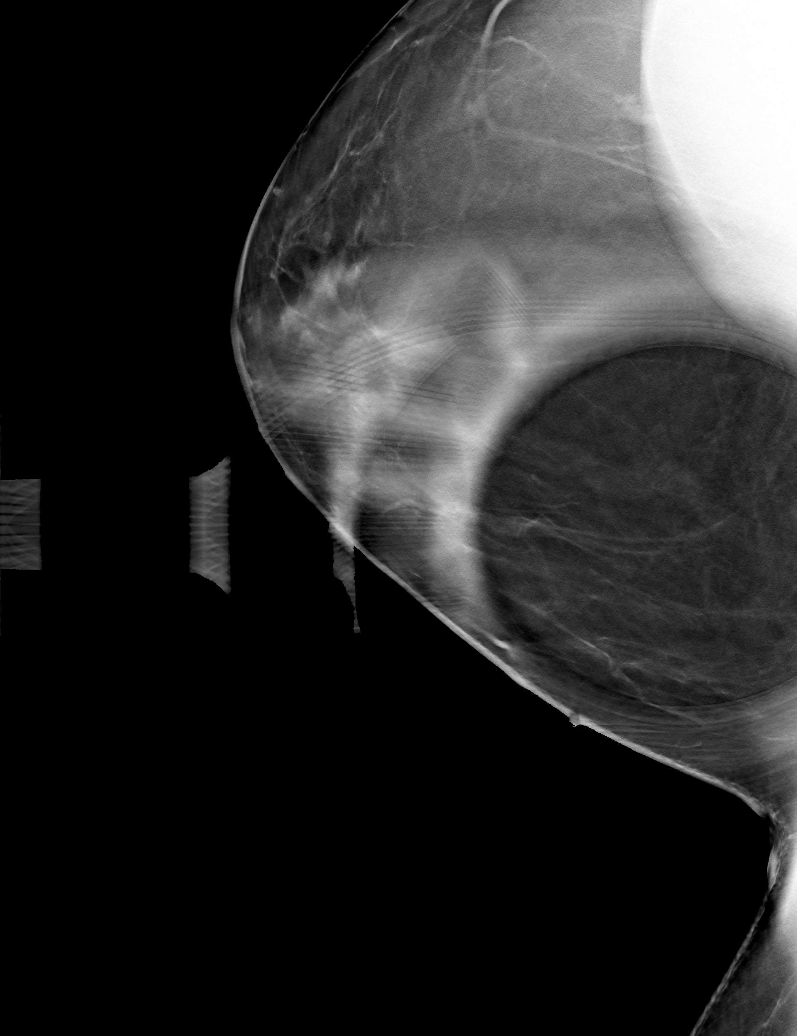

[6 of 18 positions shown; findings below may reference images not displayed]

ACR Breast Density Category b: There are scattered areas of
fibroglandular density.
FINDINGS: Spot compression tomosynthesis views were obtained over the palpable
area of concern in the RIGHT breast. No suspicious mammographic
finding is identified in this area. Fat density is noted in area. No
suspicious mass, microcalcification, or other finding is identified
in the RIGHT breast.

On physical exam, there is a firm area in the RIGHT upper inner
breast.

Targeted RIGHT breast ultrasound was performed in the palpable area
of concern at the upper inner breast. No suspicious solid or cystic
mass is identified. Scattered areas of echogenic fat with internal
cystic foci were noted, consistent with benign fat
necrosis/posttraumatic changes. Scattered benign cysts were noted
during real-time examination. A benign cyst is documented at [DATE] 4
cm from the nipple which measures 5 x 2 x 4 mm.
IMPRESSION: No mammographic or sonographic evidence of malignancy at the site of
palpable concern in the RIGHT breast. Scattered benign posttraumatic
changes are noted on ultrasound. Incidental note is made of
scattered benign cysts. Any further workup of the patient's symptoms
should be based on the clinical assessment. Recommend routine annual
screening mammogram, due February 2022.

RECOMMENDATION:
Annual screening mammography, due February 2022

I have discussed the findings and recommendations with the patient.
If applicable, a reminder letter will be sent to the patient
regarding the next appointment.

BI-RADS CATEGORY  2: Benign.

## 2023-02-25 ENCOUNTER — Other Ambulatory Visit: Payer: Self-pay | Admitting: Internal Medicine

## 2023-02-25 DIAGNOSIS — Z1231 Encounter for screening mammogram for malignant neoplasm of breast: Secondary | ICD-10-CM

## 2023-04-09 ENCOUNTER — Ambulatory Visit
Admission: RE | Admit: 2023-04-09 | Discharge: 2023-04-09 | Disposition: A | Discharge status: Home / Self Care | Payer: Medicare Other | Source: Ambulatory Visit | Attending: Internal Medicine | Admitting: Internal Medicine

## 2023-04-09 DIAGNOSIS — Z1231 Encounter for screening mammogram for malignant neoplasm of breast: Secondary | ICD-10-CM | POA: Insufficient documentation

## 2023-10-28 ENCOUNTER — Other Ambulatory Visit: Payer: Self-pay | Admitting: Internal Medicine

## 2023-10-28 DIAGNOSIS — R7303 Prediabetes: Secondary | ICD-10-CM

## 2023-10-28 DIAGNOSIS — R42 Dizziness and giddiness: Secondary | ICD-10-CM

## 2023-11-18 ENCOUNTER — Ambulatory Visit
Admission: RE | Admit: 2023-11-18 | Discharge: 2023-11-18 | Disposition: A | Discharge status: Home / Self Care | Source: Ambulatory Visit | Attending: Internal Medicine | Admitting: Internal Medicine

## 2023-11-18 DIAGNOSIS — R7303 Prediabetes: Secondary | ICD-10-CM | POA: Diagnosis present

## 2023-11-18 DIAGNOSIS — R42 Dizziness and giddiness: Secondary | ICD-10-CM | POA: Diagnosis present

## 2024-03-14 ENCOUNTER — Other Ambulatory Visit: Payer: Self-pay | Admitting: Internal Medicine

## 2024-03-14 DIAGNOSIS — Z1231 Encounter for screening mammogram for malignant neoplasm of breast: Secondary | ICD-10-CM

## 2024-03-28 ENCOUNTER — Other Ambulatory Visit: Payer: Self-pay | Admitting: Internal Medicine

## 2024-03-28 DIAGNOSIS — R7989 Other specified abnormal findings of blood chemistry: Secondary | ICD-10-CM

## 2024-04-04 ENCOUNTER — Ambulatory Visit: Admission: RE | Admit: 2024-04-04 | Discharge: 2024-04-04 | Attending: Internal Medicine | Admitting: Internal Medicine

## 2024-04-04 DIAGNOSIS — R7989 Other specified abnormal findings of blood chemistry: Secondary | ICD-10-CM

## 2024-04-06 ENCOUNTER — Other Ambulatory Visit: Payer: Self-pay | Admitting: Internal Medicine

## 2024-04-06 DIAGNOSIS — R1900 Intra-abdominal and pelvic swelling, mass and lump, unspecified site: Secondary | ICD-10-CM

## 2024-04-08 ENCOUNTER — Ambulatory Visit
Admission: RE | Admit: 2024-04-08 | Discharge: 2024-04-08 | Disposition: A | Source: Ambulatory Visit | Attending: Internal Medicine | Admitting: Internal Medicine

## 2024-04-08 ENCOUNTER — Other Ambulatory Visit: Payer: Self-pay | Admitting: Internal Medicine

## 2024-04-08 DIAGNOSIS — R1900 Intra-abdominal and pelvic swelling, mass and lump, unspecified site: Secondary | ICD-10-CM

## 2024-04-08 MED ORDER — DIPHENHYDRAMINE HCL 50 MG/ML IJ SOLN
50.0000 mg | Freq: Once | INTRAMUSCULAR | Status: AC
  Administered 2024-04-08: 50 mg via INTRAVENOUS
  Filled 2024-04-08: qty 1

## 2024-04-08 MED ORDER — GADOBUTROL 1 MMOL/ML IV SOLN
7.5000 mL | Freq: Once | INTRAVENOUS | Status: AC | PRN
  Administered 2024-04-08: 7.5 mL via INTRAVENOUS

## 2024-04-19 ENCOUNTER — Ambulatory Visit
Admission: RE | Admit: 2024-04-19 | Discharge: 2024-04-19 | Disposition: A | Discharge status: Home / Self Care | Source: Ambulatory Visit | Attending: Internal Medicine | Admitting: Internal Medicine

## 2024-04-19 DIAGNOSIS — Z1231 Encounter for screening mammogram for malignant neoplasm of breast: Secondary | ICD-10-CM | POA: Diagnosis present
